# Patient Record
Sex: Male | Born: 1980 | Hispanic: Yes | Marital: Married | Smoking: Never smoker
Health system: Southern US, Community
[De-identification: ages and names within clinical notes are randomized; demographics above are authoritative.]

---

## 2013-12-28 ENCOUNTER — Emergency Department: Payer: Self-pay | Admitting: Emergency Medicine

## 2018-10-11 ENCOUNTER — Ambulatory Visit: Payer: Self-pay | Admitting: General Surgery

## 2018-10-11 NOTE — H&P (Signed)
PATIENT PROFILE: Marcus Khan is a 38 y.o. male who presents to the Clinic for consultation at the request of Dr. Ola Spurr for evaluation of ventral hernia.  PCP:  Angelena Form, MD  HISTORY OF PRESENT ILLNESS: Mr. Marcus Khan reports patient reported that he has been dealing with a hernia since 2 to 3 years ago.  Reports that the pain he was small and without major symptoms but in the last few months he has been more painful.  He denies any pain radiation.  He reports not doing heavy lifting and sports aggravate the pain.  There is no alleviating factor.  Patient reported initially he was able to put it back inside but in the last few month is tender to palpation and he cannot reduce the hernia.  There has been no abdominal distention.  There is no nausea or vomiting.  Patient tolerating diet.  Patient having bowel movement.  Patient denies any previous abdominal surgery.   PROBLEM LIST: Ventral hernia  GENERAL REVIEW OF SYSTEMS:   General ROS: negative for - chills, fatigue, fever, weight gain or weight loss Allergy and Immunology ROS: negative for - hives  Hematological and Lymphatic ROS: negative for - bleeding problems or bruising, negative for palpable nodes Endocrine ROS: negative for - heat or cold intolerance, hair changes Respiratory ROS: negative for - cough, shortness of breath or wheezing Cardiovascular ROS: no chest pain or palpitations GI ROS: negative for nausea, vomiting, abdominal pain, diarrhea, constipation Musculoskeletal ROS: negative for - joint swelling or muscle pain Neurological ROS: negative for - confusion, syncope Dermatological ROS: negative for pruritus and rash Psychiatric: negative for anxiety, depression, difficulty sleeping and memory loss  MEDICATIONS: Current Medications  No current outpatient medications on file.   No current facility-administered medications for this visit.     Patient denies any medication at  home.  ALLERGIES: Patient has no known allergies.  PAST MEDICAL HISTORY:     Past Medical History:  Diagnosis Date  . Umbilical hernia     PAST SURGICAL HISTORY:      Past Surgical History:  Procedure Laterality Date  . EXTRACTION TEETH Bilateral 2019     FAMILY HISTORY:      Family History  Problem Relation Age of Onset  . No Known Problems Mother   . Stroke Father   . No Known Problems Sister   . No Known Problems Brother   . No Known Problems Brother   . No Known Problems Brother   . No Known Problems Brother   . No Known Problems Brother      SOCIAL HISTORY: Social History          Socioeconomic History  . Marital status: Married    Spouse name: Minette Brine  . Number of children: 3  . Years of education: Not on file  . Highest education level: High school graduate  Occupational History  . Occupation: Manufacturing systems engineer  . Financial resource strain: Not on file  . Food insecurity    Worry: Not on file    Inability: Not on file  . Transportation needs    Medical: Not on file    Non-medical: Not on file  Tobacco Use  . Smoking status: Never Smoker  . Smokeless tobacco: Never Used  Substance and Sexual Activity  . Alcohol use: Not Currently  . Drug use: Never  . Sexual activity: Yes    Partners: Female  Other Topics Concern  . Not on file  Social  History Narrative  . Not on file      PHYSICAL EXAM:    Vitals:   10/11/18 0809  BP: 131/84  Pulse: 54   Body mass index is 23.87 kg/m. Weight: 71.2 kg (157 lb)   GENERAL: Alert, active, oriented x3  HEENT: Pupils equal reactive to light. Extraocular movements are intact. Sclera clear. Palpebral conjunctiva normal red color.Pharynx clear.  NECK: Supple with no palpable mass and no adenopathy.  LUNGS: Sound clear with no rales rhonchi or wheezes.  HEART: Regular rhythm S1 and S2 without murmur.  ABDOMEN: Soft and depressible, nontender with  no palpable mass, no hepatomegaly.  Patient with a ventral hernia superior to the umbilical area.  Tender to palpation.  Unable to be reduced.  There is no sign of skin changes.  EXTREMITIES: Well-developed well-nourished symmetrical with no dependent edema.  NEUROLOGICAL: Awake alert oriented, facial expression symmetrical, moving all extremities.  REVIEW OF DATA: I have reviewed the following data today:      Initial consult on 09/28/2018  Component Date Value  . Vent Rate (bpm) 09/28/2018 67   . PR Interval (msec) 09/28/2018 154   . QRS Interval (msec) 09/28/2018 84   . QT Interval (msec) 09/28/2018 402   . QTc (msec) 09/28/2018 424   . WBC (White Blood Cell Co* 09/28/2018 5.2   . RBC (Red Blood Cell Coun* 09/28/2018 5.34   . Hemoglobin 09/28/2018 15.5   . Hematocrit 09/28/2018 46.5   . MCV (Mean Corpuscular Vo* 09/28/2018 87.1   . MCH (Mean Corpuscular He* 09/28/2018 29.0   . MCHC (Mean Corpuscular H* 09/28/2018 33.3   . Platelet Count 09/28/2018 215   . RDW-CV (Red Cell Distrib* 09/28/2018 12.5   . MPV (Mean Platelet Volum* 09/28/2018 9.8   . Neutrophils 09/28/2018 2.72   . Lymphocytes 09/28/2018 1.59   . Monocytes 09/28/2018 0.36   . Eosinophils 09/28/2018 0.48   . Basophils 09/28/2018 0.07   . Neutrophil % 09/28/2018 52.0   . Lymphocyte % 09/28/2018 30.4   . Monocyte % 09/28/2018 6.9   . Eosinophil % 09/28/2018 9.2*  . Basophil% 09/28/2018 1.3   . Immature Granulocyte % 09/28/2018 0.2   . Immature Granulocyte Cou* 09/28/2018 0.01   . Glucose 09/28/2018 87   . Sodium 09/28/2018 139   . Potassium 09/28/2018 4.4   . Chloride 09/28/2018 103   . Carbon Dioxide (CO2) 09/28/2018 32.6*  . Urea Nitrogen (BUN) 09/28/2018 17   . Creatinine 09/28/2018 0.8   . Glomerular Filtration Ra* 09/28/2018 109   . Calcium 09/28/2018 9.5   . AST  09/28/2018 16   . ALT  09/28/2018 16   . Alk Phos (alkaline Phosp* 09/28/2018 139*  . Albumin 09/28/2018 4.6   . Bilirubin, Total  09/28/2018 0.5   . Protein, Total 09/28/2018 7.6   . A/G Ratio 09/28/2018 1.5   . HIV 1/2 Antibodies 09/28/2018 Negative   . HIV 1 P24 Antigen 09/28/2018 Negative   . Cholesterol, Total 09/28/2018 122   . Triglyceride 09/28/2018 87   . HDL (High Density Lipopr* 09/28/2018 36.7   . LDL Calculated 09/28/2018 68   . VLDL Cholesterol 09/28/2018 17   . Cholesterol/HDL Ratio 09/28/2018 3.3      ASSESSMENT: Mr. Marcus Khan is a 38 y.o. male presenting for consultation for ventral hernia.    The patient presents with a symptomatic, incarcerated ventral hernia. Patient was oriented about the diagnosis of ventral hernia and its implication. The patient was oriented about the  treatment alternatives (observation vs surgical repair). Due to patient symptoms, repair is recommended. Patient oriented about the surgical procedure, the use of mesh and its risk of complications such as: infection, bleeding, injury to bowel, bladder or vasculature, and chronic pain.  Incarcerated ventral hernia [K43.6]  PLAN: 1. Ventral hernia repair with mesh (12162, [id number]) 2. CBC, CMP done on 09/28/2018 3. Avoid taking aspirin 5 days before surgery 4. Contact us if has any question or concern  Patient verbalized understanding, all questions were answered, and were agreeable with the plan outlined above.    Herbert Pun, MD  Electronically signed by Herbert Pun, MD

## 2018-10-11 NOTE — H&P (View-Only) (Signed)
PATIENT PROFILE: Jadyn Barge is a 38 y.o. male who presents to the Clinic for consultation at the request of Dr. Ola Spurr for evaluation of ventral hernia.  PCP:  Angelena Form, MD  HISTORY OF PRESENT ILLNESS: Mr. Randel Pigg reports patient reported that he has been dealing with a hernia since 2 to 3 years ago.  Reports that the pain he was small and without major symptoms but in the last few months he has been more painful.  He denies any pain radiation.  He reports not doing heavy lifting and sports aggravate the pain.  There is no alleviating factor.  Patient reported initially he was able to put it back inside but in the last few month is tender to palpation and he cannot reduce the hernia.  There has been no abdominal distention.  There is no nausea or vomiting.  Patient tolerating diet.  Patient having bowel movement.  Patient denies any previous abdominal surgery.   PROBLEM LIST: Ventral hernia  GENERAL REVIEW OF SYSTEMS:   General ROS: negative for - chills, fatigue, fever, weight gain or weight loss Allergy and Immunology ROS: negative for - hives  Hematological and Lymphatic ROS: negative for - bleeding problems or bruising, negative for palpable nodes Endocrine ROS: negative for - heat or cold intolerance, hair changes Respiratory ROS: negative for - cough, shortness of breath or wheezing Cardiovascular ROS: no chest pain or palpitations GI ROS: negative for nausea, vomiting, abdominal pain, diarrhea, constipation Musculoskeletal ROS: negative for - joint swelling or muscle pain Neurological ROS: negative for - confusion, syncope Dermatological ROS: negative for pruritus and rash Psychiatric: negative for anxiety, depression, difficulty sleeping and memory loss  MEDICATIONS: Current Medications  No current outpatient medications on file.   No current facility-administered medications for this visit.     Patient denies any medication at  home.  ALLERGIES: Patient has no known allergies.  PAST MEDICAL HISTORY:     Past Medical History:  Diagnosis Date  . Umbilical hernia     PAST SURGICAL HISTORY:      Past Surgical History:  Procedure Laterality Date  . EXTRACTION TEETH Bilateral 2019     FAMILY HISTORY:      Family History  Problem Relation Age of Onset  . No Known Problems Mother   . Stroke Father   . No Known Problems Sister   . No Known Problems Brother   . No Known Problems Brother   . No Known Problems Brother   . No Known Problems Brother   . No Known Problems Brother      SOCIAL HISTORY: Social History          Socioeconomic History  . Marital status: Married    Spouse name: Minette Brine  . Number of children: 3  . Years of education: Not on file  . Highest education level: High school graduate  Occupational History  . Occupation: Manufacturing systems engineer  . Financial resource strain: Not on file  . Food insecurity    Worry: Not on file    Inability: Not on file  . Transportation needs    Medical: Not on file    Non-medical: Not on file  Tobacco Use  . Smoking status: Never Smoker  . Smokeless tobacco: Never Used  Substance and Sexual Activity  . Alcohol use: Not Currently  . Drug use: Never  . Sexual activity: Yes    Partners: Female  Other Topics Concern  . Not on file  Social  History Narrative  . Not on file      PHYSICAL EXAM:    Vitals:   10/11/18 0809  BP: 131/84  Pulse: 54   Body mass index is 23.87 kg/m. Weight: 71.2 kg (157 lb)   GENERAL: Alert, active, oriented x3  HEENT: Pupils equal reactive to light. Extraocular movements are intact. Sclera clear. Palpebral conjunctiva normal red color.Pharynx clear.  NECK: Supple with no palpable mass and no adenopathy.  LUNGS: Sound clear with no rales rhonchi or wheezes.  HEART: Regular rhythm S1 and S2 without murmur.  ABDOMEN: Soft and depressible, nontender with  no palpable mass, no hepatomegaly.  Patient with a ventral hernia superior to the umbilical area.  Tender to palpation.  Unable to be reduced.  There is no sign of skin changes.  EXTREMITIES: Well-developed well-nourished symmetrical with no dependent edema.  NEUROLOGICAL: Awake alert oriented, facial expression symmetrical, moving all extremities.  REVIEW OF DATA: I have reviewed the following data today:      Initial consult on 09/28/2018  Component Date Value  . Vent Rate (bpm) 09/28/2018 67   . PR Interval (msec) 09/28/2018 154   . QRS Interval (msec) 09/28/2018 84   . QT Interval (msec) 09/28/2018 402   . QTc (msec) 09/28/2018 424   . WBC (White Blood Cell Co* 09/28/2018 5.2   . RBC (Red Blood Cell Coun* 09/28/2018 5.34   . Hemoglobin 09/28/2018 15.5   . Hematocrit 09/28/2018 46.5   . MCV (Mean Corpuscular Vo* 09/28/2018 87.1   . MCH (Mean Corpuscular He* 09/28/2018 29.0   . MCHC (Mean Corpuscular H* 09/28/2018 33.3   . Platelet Count 09/28/2018 215   . RDW-CV (Red Cell Distrib* 09/28/2018 12.5   . MPV (Mean Platelet Volum* 09/28/2018 9.8   . Neutrophils 09/28/2018 2.72   . Lymphocytes 09/28/2018 1.59   . Monocytes 09/28/2018 0.36   . Eosinophils 09/28/2018 0.48   . Basophils 09/28/2018 0.07   . Neutrophil % 09/28/2018 52.0   . Lymphocyte % 09/28/2018 30.4   . Monocyte % 09/28/2018 6.9   . Eosinophil % 09/28/2018 9.2*  . Basophil% 09/28/2018 1.3   . Immature Granulocyte % 09/28/2018 0.2   . Immature Granulocyte Cou* 09/28/2018 0.01   . Glucose 09/28/2018 87   . Sodium 09/28/2018 139   . Potassium 09/28/2018 4.4   . Chloride 09/28/2018 103   . Carbon Dioxide (CO2) 09/28/2018 32.6*  . Urea Nitrogen (BUN) 09/28/2018 17   . Creatinine 09/28/2018 0.8   . Glomerular Filtration Ra* 09/28/2018 109   . Calcium 09/28/2018 9.5   . AST  09/28/2018 16   . ALT  09/28/2018 16   . Alk Phos (alkaline Phosp* 09/28/2018 139*  . Albumin 09/28/2018 4.6   . Bilirubin, Total  09/28/2018 0.5   . Protein, Total 09/28/2018 7.6   . A/G Ratio 09/28/2018 1.5   . HIV 1/2 Antibodies 09/28/2018 Negative   . HIV 1 P24 Antigen 09/28/2018 Negative   . Cholesterol, Total 09/28/2018 122   . Triglyceride 09/28/2018 87   . HDL (High Density Lipopr* 09/28/2018 36.7   . LDL Calculated 09/28/2018 68   . VLDL Cholesterol 09/28/2018 17   . Cholesterol/HDL Ratio 09/28/2018 3.3      ASSESSMENT: Mr. Randel Pigg is a 38 y.o. male presenting for consultation for ventral hernia.    The patient presents with a symptomatic, incarcerated ventral hernia. Patient was oriented about the diagnosis of ventral hernia and its implication. The patient was oriented about the  treatment alternatives (observation vs surgical repair). Due to patient symptoms, repair is recommended. Patient oriented about the surgical procedure, the use of mesh and its risk of complications such as: infection, bleeding, injury to bowel, bladder or vasculature, and chronic pain.  Incarcerated ventral hernia [K43.6]  PLAN: 1. Ventral hernia repair with mesh (12162, [id number]) 2. CBC, CMP done on 09/28/2018 3. Avoid taking aspirin 5 days before surgery 4. Contact us if has any question or concern  Patient verbalized understanding, all questions were answered, and were agreeable with the plan outlined above.    Herbert Pun, MD  Electronically signed by Herbert Pun, MD

## 2018-10-13 ENCOUNTER — Other Ambulatory Visit
Admission: RE | Admit: 2018-10-13 | Discharge: 2018-10-13 | Disposition: A | Discharge status: Home / Self Care | Payer: 59 | Source: Ambulatory Visit | Attending: General Surgery | Admitting: General Surgery

## 2018-10-13 ENCOUNTER — Other Ambulatory Visit: Payer: Self-pay

## 2018-10-13 DIAGNOSIS — Z01812 Encounter for preprocedural laboratory examination: Secondary | ICD-10-CM | POA: Diagnosis present

## 2018-10-13 DIAGNOSIS — Z20828 Contact with and (suspected) exposure to other viral communicable diseases: Secondary | ICD-10-CM | POA: Diagnosis not present

## 2018-10-13 LAB — SARS CORONAVIRUS 2 (TAT 6-24 HRS): SARS Coronavirus 2: NEGATIVE

## 2018-10-18 ENCOUNTER — Other Ambulatory Visit: Payer: Self-pay

## 2018-10-18 ENCOUNTER — Ambulatory Visit: Payer: No Typology Code available for payment source | Admitting: Anesthesiology

## 2018-10-18 ENCOUNTER — Encounter
Admission: RE | Disposition: A | Discharge status: Home / Self Care | Payer: Self-pay | Source: Home / Self Care | Attending: General Surgery

## 2018-10-18 ENCOUNTER — Encounter: Payer: Self-pay | Admitting: *Deleted

## 2018-10-18 ENCOUNTER — Ambulatory Visit
Admission: RE | Admit: 2018-10-18 | Discharge: 2018-10-18 | Disposition: A | Discharge status: Home / Self Care | Payer: No Typology Code available for payment source | Attending: General Surgery | Admitting: General Surgery

## 2018-10-18 DIAGNOSIS — Z823 Family history of stroke: Secondary | ICD-10-CM | POA: Diagnosis not present

## 2018-10-18 DIAGNOSIS — K436 Other and unspecified ventral hernia with obstruction, without gangrene: Secondary | ICD-10-CM | POA: Insufficient documentation

## 2018-10-18 HISTORY — PX: VENTRAL HERNIA REPAIR: SHX424

## 2018-10-18 SURGERY — REPAIR, HERNIA, VENTRAL
Anesthesia: General | Site: Abdomen

## 2018-10-18 MED ORDER — MIDAZOLAM HCL 2 MG/2ML IJ SOLN
INTRAMUSCULAR | Status: DC | PRN
  Administered 2018-10-18: 2 mg via INTRAVENOUS

## 2018-10-18 MED ORDER — FENTANYL CITRATE (PF) 100 MCG/2ML IJ SOLN
25.0000 ug | INTRAMUSCULAR | Status: DC | PRN
  Administered 2018-10-18 (×4): 25 ug via INTRAVENOUS

## 2018-10-18 MED ORDER — FENTANYL CITRATE (PF) 100 MCG/2ML IJ SOLN
INTRAMUSCULAR | Status: AC
  Filled 2018-10-18: qty 2

## 2018-10-18 MED ORDER — FAMOTIDINE 20 MG PO TABS
ORAL_TABLET | ORAL | Status: AC
  Administered 2018-10-18: 07:00:00 20 mg via ORAL
  Filled 2018-10-18: qty 1

## 2018-10-18 MED ORDER — SUGAMMADEX SODIUM 200 MG/2ML IV SOLN
INTRAVENOUS | Status: AC
  Filled 2018-10-18: qty 2

## 2018-10-18 MED ORDER — LACTATED RINGERS IV SOLN
INTRAVENOUS | Status: DC
  Administered 2018-10-18: 08:00:00 via INTRAVENOUS

## 2018-10-18 MED ORDER — LIDOCAINE HCL (PF) 2 % IJ SOLN
INTRAMUSCULAR | Status: AC
  Filled 2018-10-18: qty 10

## 2018-10-18 MED ORDER — PROPOFOL 10 MG/ML IV BOLUS
INTRAVENOUS | Status: DC | PRN
  Administered 2018-10-18: 50 mg via INTRAVENOUS
  Administered 2018-10-18: 150 mg via INTRAVENOUS

## 2018-10-18 MED ORDER — BUPIVACAINE-EPINEPHRINE 0.5% -1:200000 IJ SOLN
INTRAMUSCULAR | Status: DC | PRN
  Administered 2018-10-18: 10 mL

## 2018-10-18 MED ORDER — ACETAMINOPHEN 10 MG/ML IV SOLN
INTRAVENOUS | Status: AC
  Filled 2018-10-18: qty 100

## 2018-10-18 MED ORDER — DEXAMETHASONE SODIUM PHOSPHATE 10 MG/ML IJ SOLN
INTRAMUSCULAR | Status: DC | PRN
  Administered 2018-10-18: 10 mg via INTRAVENOUS

## 2018-10-18 MED ORDER — FENTANYL CITRATE (PF) 100 MCG/2ML IJ SOLN
INTRAMUSCULAR | Status: AC
  Administered 2018-10-18: 25 ug via INTRAVENOUS
  Filled 2018-10-18: qty 2

## 2018-10-18 MED ORDER — CEFAZOLIN SODIUM-DEXTROSE 2-4 GM/100ML-% IV SOLN
INTRAVENOUS | Status: AC
  Filled 2018-10-18: qty 100

## 2018-10-18 MED ORDER — SUCCINYLCHOLINE CHLORIDE 20 MG/ML IJ SOLN
INTRAMUSCULAR | Status: DC | PRN
  Administered 2018-10-18: 100 mg via INTRAVENOUS

## 2018-10-18 MED ORDER — CEFAZOLIN SODIUM-DEXTROSE 2-4 GM/100ML-% IV SOLN
2.0000 g | INTRAVENOUS | Status: AC
  Administered 2018-10-18: 2 g via INTRAVENOUS

## 2018-10-18 MED ORDER — LIDOCAINE HCL (CARDIAC) PF 100 MG/5ML IV SOSY
PREFILLED_SYRINGE | INTRAVENOUS | Status: DC | PRN
  Administered 2018-10-18: 100 mg via INTRAVENOUS

## 2018-10-18 MED ORDER — ONDANSETRON HCL 4 MG/2ML IJ SOLN: 4.0000 mg | Freq: Once | INTRAMUSCULAR | Status: DC | PRN

## 2018-10-18 MED ORDER — ONDANSETRON HCL 4 MG/2ML IJ SOLN
INTRAMUSCULAR | Status: AC
  Filled 2018-10-18: qty 2

## 2018-10-18 MED ORDER — DEXAMETHASONE SODIUM PHOSPHATE 10 MG/ML IJ SOLN
INTRAMUSCULAR | Status: AC
  Filled 2018-10-18: qty 1

## 2018-10-18 MED ORDER — FAMOTIDINE 20 MG PO TABS
20.0000 mg | ORAL_TABLET | ORAL | Status: AC
  Administered 2018-10-18: 07:00:00 20 mg via ORAL

## 2018-10-18 MED ORDER — ONDANSETRON HCL 4 MG/2ML IJ SOLN
INTRAMUSCULAR | Status: DC | PRN
  Administered 2018-10-18: 4 mg via INTRAVENOUS

## 2018-10-18 MED ORDER — FENTANYL CITRATE (PF) 100 MCG/2ML IJ SOLN
INTRAMUSCULAR | Status: DC | PRN
  Administered 2018-10-18 (×2): 50 ug via INTRAVENOUS

## 2018-10-18 MED ORDER — ACETAMINOPHEN 10 MG/ML IV SOLN
INTRAVENOUS | Status: DC | PRN
  Administered 2018-10-18: 1000 mg via INTRAVENOUS

## 2018-10-18 MED ORDER — SUGAMMADEX SODIUM 200 MG/2ML IV SOLN
INTRAVENOUS | Status: DC | PRN
  Administered 2018-10-18: 200 mg via INTRAVENOUS

## 2018-10-18 MED ORDER — HYDROCODONE-ACETAMINOPHEN 5-325 MG PO TABS: 1.0000 | ORAL_TABLET | ORAL | 0 refills | Status: AC | PRN

## 2018-10-18 MED ORDER — ROCURONIUM BROMIDE 100 MG/10ML IV SOLN
INTRAVENOUS | Status: DC | PRN
  Administered 2018-10-18: 5 mg via INTRAVENOUS
  Administered 2018-10-18: 45 mg via INTRAVENOUS

## 2018-10-18 MED ORDER — BUPIVACAINE-EPINEPHRINE (PF) 0.5% -1:200000 IJ SOLN
INTRAMUSCULAR | Status: AC
  Filled 2018-10-18: qty 30

## 2018-10-18 MED ORDER — MIDAZOLAM HCL 2 MG/2ML IJ SOLN
INTRAMUSCULAR | Status: AC
  Filled 2018-10-18: qty 2

## 2018-10-18 SURGICAL SUPPLY — 31 items
BLADE SURG 15 STRL LF DISP TIS (BLADE) ×1 IMPLANT
BLADE SURG 15 STRL SS (BLADE) ×2
CANISTER SUCT 1200ML W/VALVE (MISCELLANEOUS) ×3 IMPLANT
CHLORAPREP W/TINT 26 (MISCELLANEOUS) ×3 IMPLANT
COVER WAND RF STERILE (DRAPES) ×3 IMPLANT
DERMABOND ADVANCED (GAUZE/BANDAGES/DRESSINGS) ×2
DERMABOND ADVANCED .7 DNX12 (GAUZE/BANDAGES/DRESSINGS) ×1 IMPLANT
DRAPE LAPAROTOMY 100X77 ABD (DRAPES) ×3 IMPLANT
ELECT REM PT RETURN 9FT ADLT (ELECTROSURGICAL) ×3
ELECTRODE REM PT RTRN 9FT ADLT (ELECTROSURGICAL) ×1 IMPLANT
GAUZE SPONGE 4X4 12PLY STRL (GAUZE/BANDAGES/DRESSINGS) IMPLANT
GLOVE BIO SURGEON STRL SZ 6.5 (GLOVE) ×2 IMPLANT
GLOVE BIO SURGEONS STRL SZ 6.5 (GLOVE) ×1
GLOVE BIOGEL PI IND STRL 6.5 (GLOVE) ×1 IMPLANT
GLOVE BIOGEL PI INDICATOR 6.5 (GLOVE) ×2
GOWN STRL REUS W/ TWL LRG LVL3 (GOWN DISPOSABLE) ×1 IMPLANT
GOWN STRL REUS W/TWL LRG LVL3 (GOWN DISPOSABLE) ×2
KIT TURNOVER KIT A (KITS) ×3 IMPLANT
LABEL OR SOLS (LABEL) ×3 IMPLANT
NEEDLE HYPO 25X1 1.5 SAFETY (NEEDLE) ×3 IMPLANT
NS IRRIG 500ML POUR BTL (IV SOLUTION) ×3 IMPLANT
PACK BASIN MINOR ARMC (MISCELLANEOUS) ×3 IMPLANT
SUT MNCRL 4-0 (SUTURE) ×2
SUT MNCRL 4-0 27XMFL (SUTURE) ×1
SUT PDS PLUS 0 (SUTURE) ×4
SUT PDS PLUS AB 0 CT-2 (SUTURE) ×2 IMPLANT
SUT PROLENE 0 CT 2 (SUTURE) ×6 IMPLANT
SUT VIC AB 2-0 SH 27 (SUTURE) ×2
SUT VIC AB 2-0 SH 27XBRD (SUTURE) ×1 IMPLANT
SUTURE MNCRL 4-0 27XMF (SUTURE) ×1 IMPLANT
SYR 10ML LL (SYRINGE) ×3 IMPLANT

## 2018-10-18 NOTE — Interval H&P Note (Signed)
History and Physical Interval Note:  10/18/2018 7:41 AM  Marcus Khan  has presented today for surgery, with the diagnosis of K43.6 Incarcerated ventral hernia.  The various methods of treatment have been discussed with the patient and family. After consideration of risks, benefits and other options for treatment, the patient has consented to  Procedure(s): HERNIA REPAIR VENTRAL ADULT - OPEN (N/A) as a surgical intervention.  The patient's history has been reviewed, patient examined, no change in status, stable for surgery.  I have reviewed the patient's chart and labs.  Questions were answered to the patient's satisfaction.     Herbert Pun

## 2018-10-18 NOTE — Anesthesia Procedure Notes (Signed)
Procedure Name: Intubation Date/Time: 10/18/2018 9:03 AM Performed by: Aline Brochure, CRNA Pre-anesthesia Checklist: Patient identified, Emergency Drugs available, Suction available and Patient being monitored Patient Re-evaluated:Patient Re-evaluated prior to induction Oxygen Delivery Method: Circle system utilized Preoxygenation: Pre-oxygenation with 100% oxygen Induction Type: IV induction Ventilation: Mask ventilation without difficulty Laryngoscope Size: Mac and 4 Grade View: Grade II Tube type: Oral Tube size: 7.0 mm Number of attempts: 3 Airway Equipment and Method: Stylet Placement Confirmation: ETT inserted through vocal cords under direct vision,  positive ETCO2 and breath sounds checked- equal and bilateral Secured at: 22 cm Tube secured with: Tape Dental Injury: Bloody posterior oropharynx

## 2018-10-18 NOTE — Anesthesia Preprocedure Evaluation (Signed)
Anesthesia Evaluation  Patient identified by MRN, date of birth, ID band Patient awake    Reviewed: Allergy & Precautions, NPO status , Patient's Chart, lab work & pertinent test results  Airway Mallampati: II  TM Distance: >3 FB     Dental   Pulmonary neg pulmonary ROS,    Pulmonary exam normal        Cardiovascular negative cardio ROS Normal cardiovascular exam     Neuro/Psych negative neurological ROS  negative psych ROS   GI/Hepatic Neg liver ROS,   Endo/Other  negative endocrine ROS  Renal/GU negative Renal ROS  negative genitourinary   Musculoskeletal negative musculoskeletal ROS (+)   Abdominal Normal abdominal exam  (+)   Peds negative pediatric ROS (+)  Hematology negative hematology ROS (+)   Anesthesia Other Findings History reviewed. No pertinent past medical history.  Reproductive/Obstetrics                             Anesthesia Physical Anesthesia Plan  ASA: I  Anesthesia Plan: General   Post-op Pain Management:    Induction: Intravenous  PONV Risk Score and Plan:   Airway Management Planned: Oral ETT  Additional Equipment:   Intra-op Plan:   Post-operative Plan: Extubation in OR  Informed Consent: I have reviewed the patients History and Physical, chart, labs and discussed the procedure including the risks, benefits and alternatives for the proposed anesthesia with the patient or authorized representative who has indicated his/her understanding and acceptance.     Dental advisory given  Plan Discussed with: CRNA and Surgeon  Anesthesia Plan Comments:         Anesthesia Quick Evaluation

## 2018-10-18 NOTE — Op Note (Signed)
Preoperative diagnosis: Ventral hernia.   Postoperative diagnosis: Ventral hernia.   Procedure: Ventral hernia repair  Anesthesia: GETA   Surgeon: Dr. Windell Moment   Wound Classification: Clean   Indications: Patient is a 38 y.o. male developed a symptomatic incarcerated ventral hernia. This was incarcerated with pain and repair was indicated.    Findings: 1. A 0.8 cm ventral hernia  2.   Primary tension-free repair was able to be performed.   3. No hollow viscus organ injury identified during procedure 4. Tension free repair achieved 5. Adequate hemostasis   Description of procedure: The patient was brought to the operating room and general anesthesia was induced. A time-out was completed verifying correct patient, procedure, site, positioning, and implant(s) and/or special equipment prior to beginning this procedure. Antibiotics were administered prior to making the incision. The anterior abdominal wall was prepped and draped in the standard sterile fashion. A  vertical incision was made over the palpable incarcerated hernia content. The incision was deepened to the fascia. The hernia sac was then identified and dissected free. The peritoneum of the sac was dissected from the anterior fascia and able to be reduced easily. The fascia was carefully palpated no additional defects were identified.  Since the defect was less than 1 cm, decision was made not to insert a mesh.  The hernia defect was closed with interrupted PDS 0 suture on midline.  Subcutaneous tissue was approximated with 2-0 Vicryl.  The skin was closed with subcuticular sutures of Monocryl 4-0. Wound covered with Dermabond.   The patient tolerated the procedure well and was brought to the postanesthesia care unit in stable condition.    Specimen:  Hernia content    Complications: None   Estimated Blood Loss: 2 mL

## 2018-10-18 NOTE — Discharge Instructions (Signed)
  Diet: Resume home heart healthy regular diet.   Activity: No heavy lifting >20 pounds (children, pets, laundry, garbage) or strenuous activity until follow-up, but light activity and walking are encouraged. Do not drive or drink alcohol if taking narcotic pain medications.  Wound care: May shower with soapy water and pat dry (do not rub incisions), but no baths or submerging incision underwater until follow-up. (no swimming)   Medications: Resume all home medications. For mild to moderate pain: acetaminophen (Tylenol) or ibuprofen (if no kidney disease). Combining Tylenol with alcohol can substantially increase your risk of causing liver disease. Narcotic pain medications, if prescribed, can be used for severe pain, though may cause nausea, constipation, and drowsiness. Do not combine Tylenol and Norco within a 6 hour period as Norco contains Tylenol. If you do not need the narcotic pain medication, you do not need to fill the prescription.  Call office ([phone]) at any time if any questions, worsening pain, fevers/chills, bleeding, drainage from incision site, or other concerns.   AMBULATORY SURGERY  DISCHARGE INSTRUCTIONS   1) The drugs that you were given will stay in your system until tomorrow so for the next 24 hours you should not:  A) Drive an automobile B) Make any legal decisions C) Drink any alcoholic beverage   2) You may resume regular meals tomorrow.  Today it is better to start with liquids and gradually work up to solid foods.  You may eat anything you prefer, but it is better to start with liquids, then soup and crackers, and gradually work up to solid foods.   3) Please notify your doctor immediately if you have any unusual bleeding, trouble breathing, redness and pain at the surgery site, drainage, fever, or pain not relieved by medication.    4) Additional Instructions:        Please contact your physician with any problems or Same Day Surgery at  [phone], Monday through Friday 6 am to 4 pm, or  at [place] Main number at [phone]. 

## 2018-10-18 NOTE — Transfer of Care (Signed)
Immediate Anesthesia Transfer of Care Note  Patient: Marcus Khan  Procedure(s) Performed: HERNIA REPAIR VENTRAL ADULT - OPEN (N/A Abdomen)  Patient Location: PACU  Anesthesia Type:General  Level of Consciousness: sedated  Airway & Oxygen Therapy: Patient connected to face mask oxygen  Post-op Assessment: Post -op Vital signs reviewed and stable  Post vital signs: stable  Last Vitals:  Vitals Value Taken Time  BP 104/59 10/18/18 0951  Temp 36.1 C 10/18/18 0951  Pulse 73 10/18/18 0952  Resp 15 10/18/18 0952  SpO2 100 % 10/18/18 0952  Vitals shown include unvalidated device data.  Last Pain:  Vitals:   10/18/18 0707  TempSrc: Tympanic  PainSc: 0-No pain         Complications: No apparent anesthesia complications

## 2018-10-18 NOTE — Anesthesia Post-op Follow-up Note (Signed)
Anesthesia QCDR form completed.        

## 2018-10-19 LAB — SURGICAL PATHOLOGY

## 2018-10-20 NOTE — Anesthesia Postprocedure Evaluation (Signed)
Anesthesia Post Note  Patient: Marcus Khan  Procedure(s) Performed: HERNIA REPAIR VENTRAL ADULT - OPEN (N/A Abdomen)  Patient location during evaluation: PACU Anesthesia Type: General Level of consciousness: awake and alert and oriented Pain management: pain level controlled Vital Signs Assessment: post-procedure vital signs reviewed and stable Respiratory status: spontaneous breathing Cardiovascular status: blood pressure returned to baseline Anesthetic complications: no     Last Vitals:  Vitals:   10/18/18 1044 10/18/18 1056  BP: 138/84 128/77  Pulse: 62 66  Resp: 13 18  Temp: (!) 36.2 C 36.8 C  SpO2: 98% 99%    Last Pain:  Vitals:   10/18/18 1056  TempSrc: Oral  PainSc: 0-No pain                 Gianluca Chhim

## 2019-03-24 ENCOUNTER — Ambulatory Visit: Payer: 59 | Attending: Internal Medicine

## 2019-03-24 DIAGNOSIS — Z23 Encounter for immunization: Secondary | ICD-10-CM

## 2019-03-24 NOTE — Progress Notes (Signed)
   Covid-19 Vaccination Clinic  Name:  Marcus Khan    MRN: [id number] DOB: May 20, 1980  03/24/2019  Mr. Marcus Khan was observed post Covid-19 immunization for 15 minutes without incident. He was provided with Vaccine Information Sheet and instruction to access the V-Safe system.   Mr. Marcus Khan was instructed to call 911 with any severe reactions post vaccine: Marland Kitchen Difficulty breathing  . Swelling of face and throat  . A fast heartbeat  . A bad rash all over body  . Dizziness and weakness   Immunizations Administered    Name Date Dose VIS Date Route   Pfizer COVID-19 Vaccine 03/24/2019  7:55 PM 0.3 mL 12/15/2018 Intramuscular   Manufacturer: [employer], [employer]   Lot: [id number]   NDC: [address]-3

## 2019-04-14 ENCOUNTER — Ambulatory Visit: Payer: Self-pay | Attending: Internal Medicine

## 2019-04-14 DIAGNOSIS — Z23 Encounter for immunization: Secondary | ICD-10-CM

## 2019-04-14 NOTE — Progress Notes (Signed)
   Covid-19 Vaccination Clinic  Name:  Marcus Khan    MRN: [id number] DOB: [birth year]  04/14/2019  Mr. Marcus Khan was observed post Covid-19 immunization for 15 minutes without incident. He was provided with Vaccine Information Sheet and instruction to access the V-Safe system. Medical Interpreter used.  Mr. Marcus Khan was instructed to call 911 with any severe reactions post vaccine: Marland Kitchen Difficulty breathing  . Swelling of face and throat  . A fast heartbeat  . A bad rash all over body  . Dizziness and weakness   Immunizations Administered    Name Date Dose VIS Date Route   Pfizer COVID-19 Vaccine 04/14/2019  6:08 PM 0.3 mL 12/15/2018 Intramuscular   Manufacturer: [employer], [employer]   Lot: [id number]   NDC: [address]-9

## 2019-08-07 ENCOUNTER — Encounter: Payer: Self-pay | Admitting: Emergency Medicine

## 2019-08-07 ENCOUNTER — Other Ambulatory Visit: Payer: Self-pay

## 2019-08-07 ENCOUNTER — Emergency Department
Admission: EM | Admit: 2019-08-07 | Discharge: 2019-08-07 | Disposition: A | Discharge status: Home / Self Care | Payer: No Typology Code available for payment source | Attending: Emergency Medicine | Admitting: Emergency Medicine

## 2019-08-07 ENCOUNTER — Emergency Department: Payer: No Typology Code available for payment source

## 2019-08-07 DIAGNOSIS — A749 Chlamydial infection, unspecified: Secondary | ICD-10-CM | POA: Insufficient documentation

## 2019-08-07 DIAGNOSIS — R300 Dysuria: Secondary | ICD-10-CM | POA: Diagnosis not present

## 2019-08-07 DIAGNOSIS — L03115 Cellulitis of right lower limb: Secondary | ICD-10-CM | POA: Diagnosis not present

## 2019-08-07 DIAGNOSIS — M25461 Effusion, right knee: Secondary | ICD-10-CM | POA: Diagnosis present

## 2019-08-07 LAB — URINALYSIS, COMPLETE (UACMP) WITH MICROSCOPIC
Bacteria, UA: NONE SEEN
Bilirubin Urine: NEGATIVE
Glucose, UA: NEGATIVE mg/dL
Hgb urine dipstick: NEGATIVE
Ketones, ur: 5 mg/dL — AB
Leukocytes,Ua: NEGATIVE
Nitrite: NEGATIVE
Protein, ur: NEGATIVE mg/dL
Specific Gravity, Urine: 1.019 (ref 1.005–1.030)
pH: 5 (ref 5.0–8.0)

## 2019-08-07 LAB — CBC WITH DIFFERENTIAL/PLATELET
Abs Immature Granulocytes: 0.04 10*3/uL (ref 0.00–0.07)
Basophils Absolute: 0.1 10*3/uL (ref 0.0–0.1)
Basophils Relative: 1 %
Eosinophils Absolute: 0.5 10*3/uL (ref 0.0–0.5)
Eosinophils Relative: 4 %
HCT: 41.1 % (ref 39.0–52.0)
Hemoglobin: 13.7 g/dL (ref 13.0–17.0)
Immature Granulocytes: 0 %
Lymphocytes Relative: 12 %
Lymphs Abs: 1.5 10*3/uL (ref 0.7–4.0)
MCH: 29.3 pg (ref 26.0–34.0)
MCHC: 33.3 g/dL (ref 30.0–36.0)
MCV: 87.8 fL (ref 80.0–100.0)
Monocytes Absolute: 0.8 10*3/uL (ref 0.1–1.0)
Monocytes Relative: 7 %
Neutro Abs: 9.1 10*3/uL — ABNORMAL HIGH (ref 1.7–7.7)
Neutrophils Relative %: 76 %
Platelets: 187 10*3/uL (ref 150–400)
RBC: 4.68 MIL/uL (ref 4.22–5.81)
RDW: 13.3 % (ref 11.5–15.5)
WBC: 12.1 10*3/uL — ABNORMAL HIGH (ref 4.0–10.5)
nRBC: 0 % (ref 0.0–0.2)

## 2019-08-07 LAB — BASIC METABOLIC PANEL
Anion gap: 8 (ref 5–15)
BUN: 12 mg/dL (ref 6–20)
CO2: 28 mmol/L (ref 22–32)
Calcium: 9 mg/dL (ref 8.9–10.3)
Chloride: 103 mmol/L (ref 98–111)
Creatinine, Ser: 0.98 mg/dL (ref 0.61–1.24)
GFR calc Af Amer: >60 mL/min (ref >60)
GFR calc non Af Amer: >60 mL/min (ref >60)
Glucose, Bld: 96 mg/dL (ref 70–99)
Potassium: 3.7 mmol/L (ref 3.5–5.1)
Sodium: 139 mmol/L (ref 135–145)

## 2019-08-07 LAB — CHLAMYDIA/NGC RT PCR (ARMC ONLY)
Chlamydia Tr: DETECTED — AB
N gonorrhoeae: NOT DETECTED

## 2019-08-07 LAB — URIC ACID: Uric Acid, Serum: 4.5 mg/dL (ref 3.7–8.6)

## 2019-08-07 MED ORDER — CEFAZOLIN SODIUM-DEXTROSE 2-4 GM/100ML-% IV SOLN
2.0000 g | Freq: Once | INTRAVENOUS | Status: AC
  Administered 2019-08-07: 2 g via INTRAVENOUS
  Filled 2019-08-07: qty 100

## 2019-08-07 MED ORDER — AZITHROMYCIN 500 MG PO TABS
1000.0000 mg | ORAL_TABLET | Freq: Once | ORAL | Status: AC
  Administered 2019-08-07: 1000 mg via ORAL
  Filled 2019-08-07: qty 2

## 2019-08-07 MED ORDER — SULFAMETHOXAZOLE-TRIMETHOPRIM 800-160 MG PO TABS: 1.0000 | ORAL_TABLET | Freq: Two times a day (BID) | ORAL | 0 refills | Status: AC

## 2019-08-07 NOTE — ED Notes (Signed)
Spanish interpreter used for discharge

## 2019-08-07 NOTE — ED Notes (Signed)
Medication administration information given via Video interpreter. Patient also c/o 8/10 headache.  Wife is at bedside.

## 2019-08-07 NOTE — ED Provider Notes (Signed)
[facility] Emergency Department Provider Note  ____________________________________________   First MD Initiated Contact with Patient 08/07/19 1418     (approximate)  I have reviewed the triage vital signs and the nursing notes.   HISTORY  Chief Complaint Knee Pain    HPI Lopaka T Jacobe Study is a 39 y.o. male presents emergency department complaint of right knee swelling.  Right knee started to hurt yesterday and became red and swollen overnight.  Patient states he has had some fever and chills.  Also has some burning with urination.  He denies any known injury.    History reviewed. No pertinent past medical history.  There are no problems to display for this patient.   Past Surgical History:  Procedure Laterality Date   VENTRAL HERNIA REPAIR N/A 10/18/2018   Procedure: HERNIA REPAIR VENTRAL ADULT - OPEN;  Surgeon: Carolan Shiver, MD;  Location: [facility] ORS;  Service: General;  Laterality: N/A;    Prior to Admission medications   Medication Sig Start Date End Date Taking? Authorizing Provider  sulfamethoxazole-trimethoprim (BACTRIM DS) 800-160 MG tablet Take 1 tablet by mouth 2 (two) times daily. 08/07/19   Faythe Ghee, PA-C    Allergies Patient has no known allergies.  No family history on file.  Social History Social History   Tobacco Use   Smoking status: Never Smoker   Smokeless tobacco: Never Used  Substance Use Topics   Alcohol use: Never   Drug use: Never    Review of Systems  Constitutional: Positive fever/chills Eyes: No visual changes. ENT: No sore throat. Respiratory: Denies cough Cardiovascular: Denies chest pain Gastrointestinal: Denies abdominal pain Genitourinary: Negative for dysuria. Musculoskeletal: Negative for back pain.  Positive right knee pain Skin: Negative for rash. Psychiatric: no mood changes,     ____________________________________________   PHYSICAL EXAM:  VITAL SIGNS: ED  Triage Vitals [08/07/19 1412]  Enc Vitals Group     BP 135/81     Pulse Rate 88     Resp 20     Temp (!) 97.5 F (36.4 C)     Temp Source Oral     SpO2 100 %     Weight 154 lb (69.9 kg)     Height 5\' 7"  (1.702 m)     Head Circumference      Peak Flow      Pain Score 8     Pain Loc      Pain Edu?      Excl. in GC?     Constitutional: Alert and oriented. Well appearing and in no acute distress. Eyes: Conjunctivae are normal.  Head: Atraumatic. Nose: No congestion/rhinnorhea. Mouth/Throat: Mucous membranes are moist.   Neck:  supple no lymphadenopathy noted Cardiovascular: Normal rate, regular rhythm. Heart sounds are normal Respiratory: Normal respiratory effort.  No retractions, lungs c t a  GU: deferred Musculoskeletal: Right knee is swollen and tender at the joint line, redness extends from the tibial tuberosity to the suprapatellar bursa, neurovascular is intact  neurologic:  Normal speech and language.  Skin:  Skin is warm, dry and intact. No rash noted.  Positive redness and swelling of the right knee Psychiatric: Mood and affect are normal. Speech and behavior are normal.  ____________________________________________   LABS (all labs ordered are listed, but only abnormal results are displayed)  Labs Reviewed  CHLAMYDIA/NGC RT PCR ([facility] ONLY) - Abnormal; Notable for the following components:      Result Value   Chlamydia Tr DETECTED (*)  All other components within normal limits  URINALYSIS, COMPLETE (UACMP) WITH MICROSCOPIC - Abnormal; Notable for the following components:   Color, Urine YELLOW (*)    APPearance HAZY (*)    Ketones, ur 5 (*)    All other components within normal limits  CBC WITH DIFFERENTIAL/PLATELET - Abnormal; Notable for the following components:   WBC 12.1 (*)    Neutro Abs 9.1 (*)    All other components within normal limits  BASIC METABOLIC PANEL  URIC ACID    ____________________________________________   ____________________________________________  RADIOLOGY  X-ray of the right knee is negative for any acute abnormality  ____________________________________________   PROCEDURES  Procedure(s) performed: No  Procedures    ____________________________________________   INITIAL IMPRESSION / ASSESSMENT AND PLAN / ED COURSE  Pertinent labs & imaging results that were available during my care of the patient were reviewed by me and considered in my medical decision making (see chart for details).   The patient is 39 year old male presents emergency department with concerns of swollen red knee.  Some fever and chills.  And difficulty with urination.  See HPI  Physical exam shows patient to appear well.  Vitals are basically normal.  Right knee is red and swollen and tender  DDx: Cellulitis, septic joint, chlamydia infection  CBC has elevated WBC of 12.1, urinalysis normal, basic metabolic panel is normal,  Patient has been n.p.o. all day.  I did page Dr. Joice Lofts, I discussed my concerns of a septic joint with him.  He will be coming down from the OR to evaluate the patient.  I did let the patient know the current treatment plan.  Explained to him that if Dr. Joice Lofts thinks it is a infected joint he will need to go to the operating room.  He is to remain n.p.o. while here in the ED.   Dr.Poggi in to see the patient.  Does not feel this is a septic joint but more of a cellulitis/bursitis along with his STD.  He recommends Ancef 2 g IV, Septra DS 1 twice daily for 10 days.  Follow-up in his office this week.  Patient was given the above medications.  Was also given Zithromax 1 g p.o.  His wife was also treated while here in the ED.  He was discharged with prescription for Septra DS twice daily.  He was discharged stable condition.    Tanav T Caidon Foti was evaluated in Emergency Department on 08/07/2019 for the symptoms described in the  history of present illness. He was evaluated in the context of the global COVID-19 pandemic, which necessitated consideration that the patient might be at risk for infection with the SARS-CoV-2 virus that causes COVID-19. Institutional protocols and algorithms that pertain to the evaluation of patients at risk for COVID-19 are in a state of rapid change based on information released by regulatory bodies including the CDC and federal and state organizations. These policies and algorithms were followed during the patient's care in the ED.    As part of my medical decision making, I reviewed the following data within the electronic MEDICAL RECORD NUMBER Nursing notes reviewed and incorporated, Interpreter needed, Labs reviewed , Old chart reviewed, Radiograph reviewed , A consult was requested and obtained from this/these consultant(s) Orthopedics, Notes from prior ED visits and [place] Controlled Substance Database  ____________________________________________   FINAL CLINICAL IMPRESSION(S) / ED DIAGNOSES  Final diagnoses:  Cellulitis of right knee  Chlamydia      NEW MEDICATIONS STARTED DURING THIS VISIT:  New Prescriptions   SULFAMETHOXAZOLE-TRIMETHOPRIM (BACTRIM DS) 800-160 MG TABLET    Take 1 tablet by mouth 2 (two) times daily.     Note:  This document was prepared using Dragon voice recognition software and may include unintentional dictation errors.    Faythe Ghee, PA-C 08/07/19 1820    Delton Prairie, MD 08/07/19 2110

## 2019-08-07 NOTE — Consult Note (Signed)
ORTHOPAEDIC CONSULTATION  REQUESTING PHYSICIAN: Delton Prairie, MD  Chief Complaint:   Right knee pain.  History of Present Illness: Marcus Khan is a 39 y.o. male in otherwise good health who lives independently with his wife.  He first noted some increased pain and swelling to the anterior aspect of his right knee yesterday morning.  The symptoms have worsened over the past 24 hours, prompting him to present to the emergency room.  The patient denies any specific injury to the knee, although he notes that he did a lot of walking up and down stairs over the day or 2 prior to the onset of his symptoms.  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  He denies any fevers or chills, and denies any numbness or paresthesias down his leg to his foot.  He does complain of burning with urination and a urine culture in the emergency room was positive for chlamydia.  History reviewed. No pertinent past medical history. Past Surgical History:  Procedure Laterality Date  . VENTRAL HERNIA REPAIR N/A 10/18/2018   Procedure: HERNIA REPAIR VENTRAL ADULT - OPEN;  Surgeon: Carolan Shiver, MD;  Location: [facility] ORS;  Service: General;  Laterality: N/A;   Social History   Socioeconomic History  . Marital status: Married    Spouse name: Not on file  . Number of children: Not on file  . Years of education: Not on file  . Highest education level: Not on file  Occupational History  . Not on file  Tobacco Use  . Smoking status: Never Smoker  . Smokeless tobacco: Never Used  Substance and Sexual Activity  . Alcohol use: Never  . Drug use: Never  . Sexual activity: Not on file  Other Topics Concern  . Not on file  Social History Narrative  . Not on file   Social Determinants of Health   Financial Resource Strain:   . Difficulty of Paying Living Expenses:   Food Insecurity:   . Worried  About Programme researcher, broadcasting/film/video in the Last Year:   . Barista in the Last Year:   Transportation Needs:   . Freight forwarder (Medical):   Marland Kitchen Lack of Transportation (Non-Medical):   Physical Activity:   . Days of Exercise per Week:   . Minutes of Exercise per Session:   Stress:   . Feeling of Stress :   Social Connections:   . Frequency of Communication with Friends and Family:   . Frequency of Social Gatherings with Friends and Family:   . Attends Religious Services:   . Active Member of Clubs or Organizations:   . Attends Banker Meetings:   Marland Kitchen Marital Status:    No family history on file. No Known Allergies Prior to Admission medications   Not on File   DG Knee Complete 4 Views Right  Result Date: 08/07/2019 CLINICAL DATA:  Knee pain and swelling, no known injury, initial encounter EXAM: RIGHT KNEE - COMPLETE 4+ VIEW COMPARISON:  None. FINDINGS: No evidence of fracture, dislocation, or joint effusion. No evidence of arthropathy or other focal bone abnormality. Soft tissues are unremarkable. IMPRESSION: No acute fracture or dislocation is noted. No soft tissue abnormality is seen. Electronically Signed   By: Alcide Clever M.D.   On: 08/07/2019 14:47    Positive ROS: All other systems have been reviewed and were otherwise negative with the exception of those mentioned in the  HPI and as above.  Physical Exam: General:  Alert, no acute distress Psychiatric:  Patient is competent for consent with normal mood and affect   Cardiovascular:  No pedal edema Respiratory:  No wheezing, non-labored breathing GI:  Abdomen is soft and non-tender Skin:  No lesions in the area of chief complaint Neurologic:  Sensation intact distally Lymphatic:  No axillary or cervical lymphadenopathy  Orthopedic Exam:  Orthopedic examination is limited to the right knee and lower extremity.  Skin inspection around the right knee is noted for some swelling and erythema over the anterior  aspect of the knee beginning near the superior pole of the patella and extending down to just below the tibial tubercle.  It also extends several centimeters beyond the medial and lateral margins of the patella.  There is some bogginess in the prepatellar region, especially over the patella tendon.  There does not appear to be any effusion.  The patient is able to perform a straight leg raise and is able to actively flex his knee beyond 120 degrees without significant pain.  The knee is stable to varus valgus stressing.  He is neurovascularly intact to the right lower extremity and foot.  X-rays:  X-rays of his right knee are available for review and have been reviewed by myself.  These films demonstrate no evidence for fractures, lytic lesions, or significant degenerative changes.  Assessment: Septic right prepatellar bursitis.  Plan: Using the Spanish interpreter "on a stick", the treatment options have been discussed with the patient.  I feel that this can be managed nonsurgically at this point, especially as the patient is afebrile and not septic appearing.  He should get a dose of IV antibiotics, specifically Ancef, while in the emergency room, then be sent home with a prescription for a 10-day course of oral antibiotics.  I have recommended Septra DS 1 p.o. twice daily for 10 days.  Thank you for asking me to participate in the care of this pleasant yet unfortunate man.  We will plan on seeing him back in the office in 2 to 3 days to be sure that he is responding appropriately to the antibiotics.   Maryagnes Amos, MD  Beeper #:  [phone]  08/07/2019 5:46 PM

## 2019-08-07 NOTE — ED Triage Notes (Signed)
Pt presents to ED via POV with c/o swelling to R knee since yesterday. Pt denies known injury at this time. Pt ambulatory without difficulty.   Maryjane Hurter, interpreter in triage at this time.

## 2019-08-07 NOTE — ED Notes (Signed)
See triage note  Presents with swelling to right knee  Swelling and some pain since yesterday  No injury  Ambulates well  Min swelling noted

## 2020-01-08 ENCOUNTER — Other Ambulatory Visit: Payer: Self-pay | Admitting: Infectious Diseases

## 2020-01-08 DIAGNOSIS — G44209 Tension-type headache, unspecified, not intractable: Secondary | ICD-10-CM

## 2020-01-08 DIAGNOSIS — R29898 Other symptoms and signs involving the musculoskeletal system: Secondary | ICD-10-CM

## 2020-01-08 DIAGNOSIS — M542 Cervicalgia: Secondary | ICD-10-CM

## 2020-01-09 ENCOUNTER — Other Ambulatory Visit: Payer: Self-pay

## 2020-01-09 ENCOUNTER — Ambulatory Visit
Admission: RE | Admit: 2020-01-09 | Discharge: 2020-01-09 | Disposition: A | Discharge status: Home / Self Care | Payer: No Typology Code available for payment source | Source: Ambulatory Visit | Attending: Infectious Diseases | Admitting: Infectious Diseases

## 2020-01-09 DIAGNOSIS — R29898 Other symptoms and signs involving the musculoskeletal system: Secondary | ICD-10-CM | POA: Insufficient documentation

## 2020-01-09 DIAGNOSIS — G44209 Tension-type headache, unspecified, not intractable: Secondary | ICD-10-CM | POA: Insufficient documentation

## 2020-01-09 DIAGNOSIS — M542 Cervicalgia: Secondary | ICD-10-CM | POA: Insufficient documentation

## 2021-10-15 ENCOUNTER — Ambulatory Visit (LOCAL_COMMUNITY_HEALTH_CENTER): Payer: No Typology Code available for payment source

## 2021-10-15 DIAGNOSIS — Z23 Encounter for immunization: Secondary | ICD-10-CM | POA: Diagnosis not present

## 2021-10-15 DIAGNOSIS — Z719 Counseling, unspecified: Secondary | ICD-10-CM

## 2021-10-15 NOTE — Progress Notes (Signed)
Patient provided documentation of vaccines needed for immigration.  Interpretation provided by Rowland Lathe.  VIS given for vaccines, vaccines administered and tolerated well.  After vaccine care reviewed. Copy of [facility] provided.   Ishia Tenorio Shelda Pal, RN

## 2021-10-27 IMAGING — CT CT HEAD W/O CM
3 series · 16 of 47 positions shown, 19 images · non-contrast
Comparison: None.

CLINICAL DATA: Dizziness.

EXAM:
CT HEAD WITHOUT CONTRAST
TECHNIQUE: Contiguous axial images were obtained from the base of the skull
through the vertex without intravenous contrast.

[Series 3: head wo · axial · 0.39mm/px · z∈[-106,+19]mm · 10 of 31 slices shown, 13 images]
[im 3/31  brain]
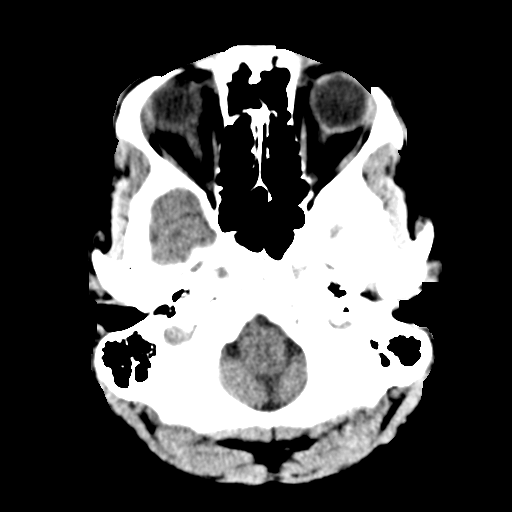
[im 3/31  bone]
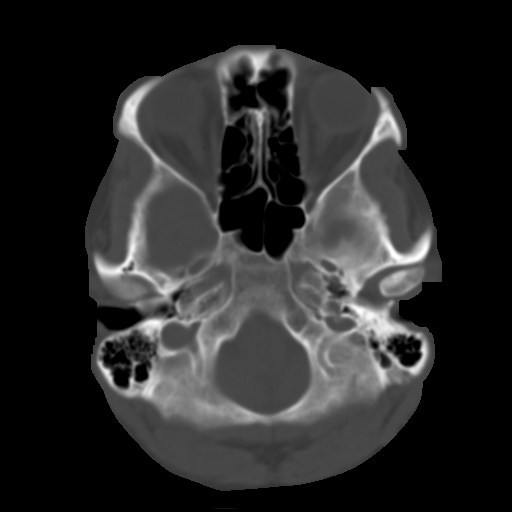
[im 6/31  brain]
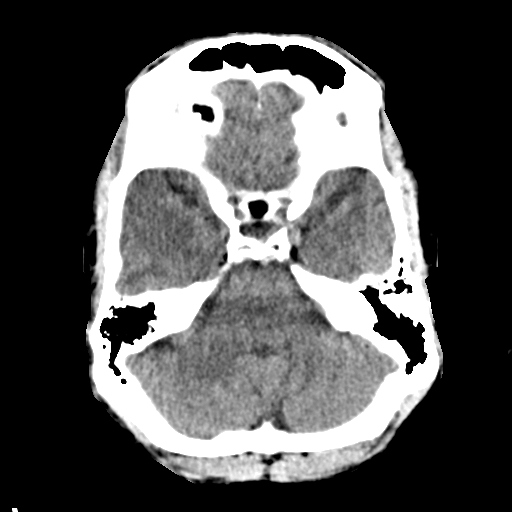
[im 9/31  brain]
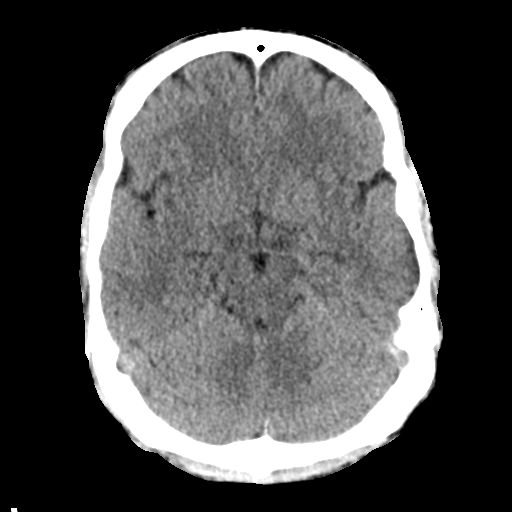
[im 11/31  brain]
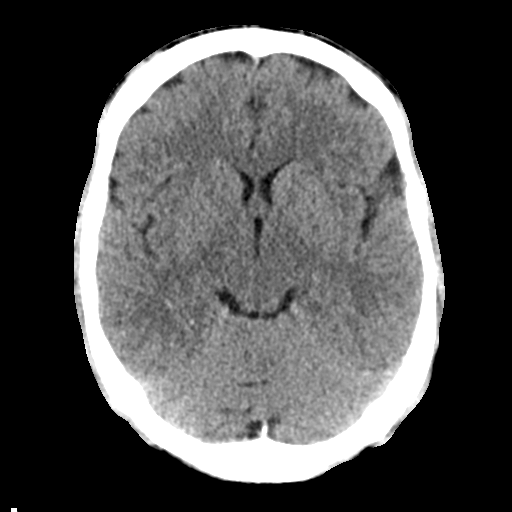
[im 14/31  brain]
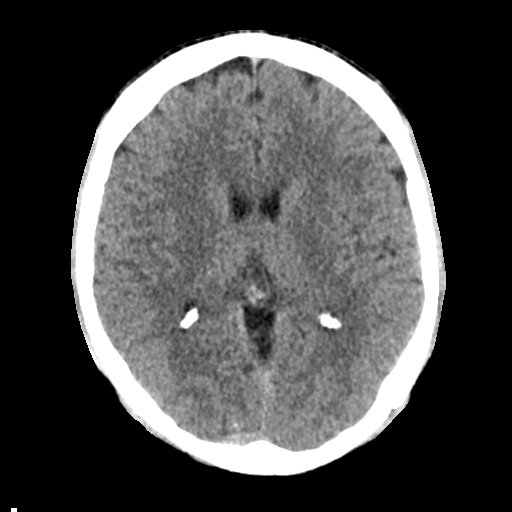
[im 14/31  bone]
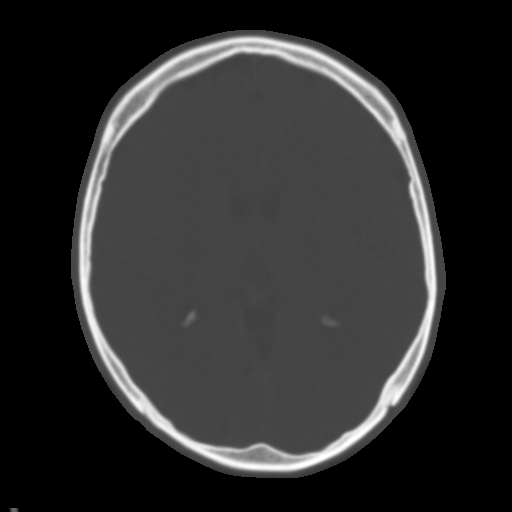
[im 17/31  brain]
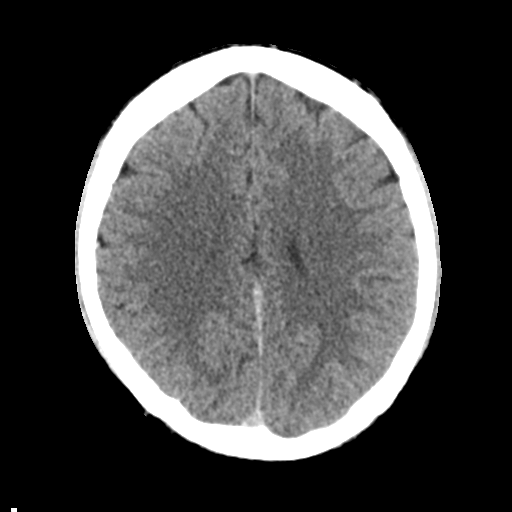
[im 20/31  brain]
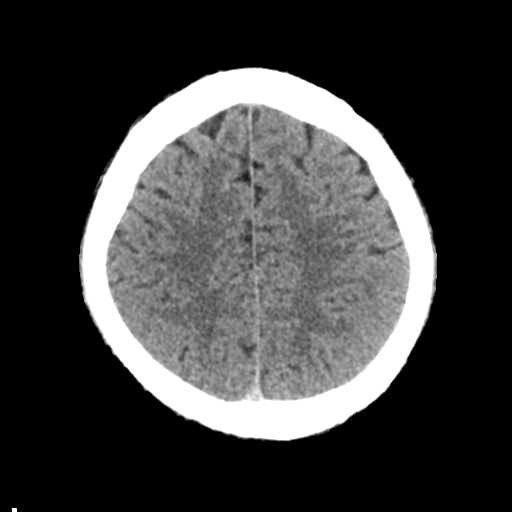
[im 23/31  brain]
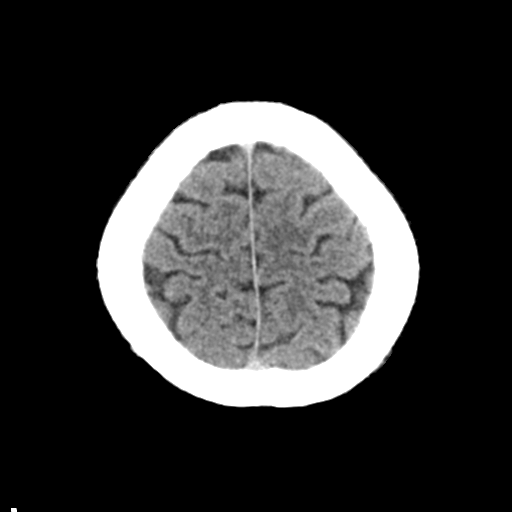
[im 25/31  brain]
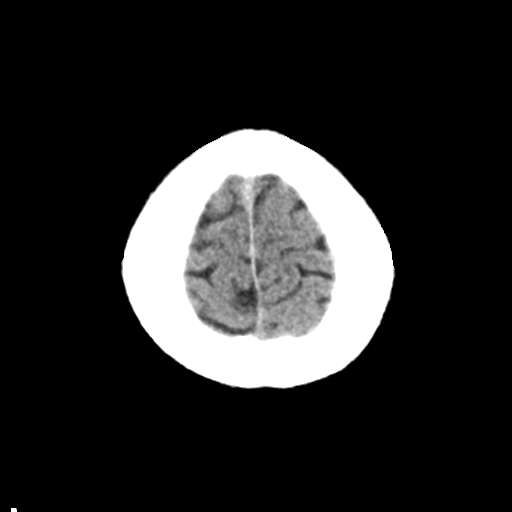
[im 25/31  bone]
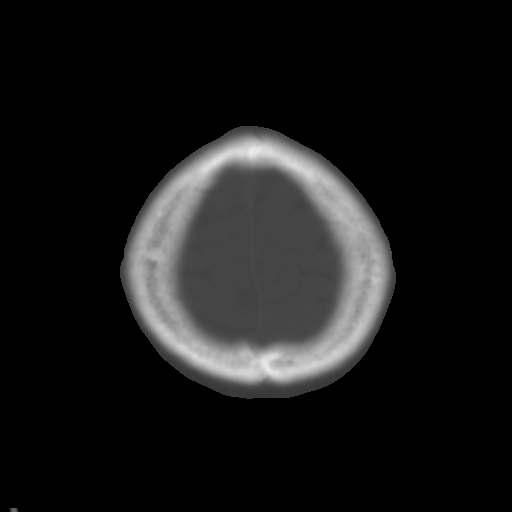
[im 28/31  brain]
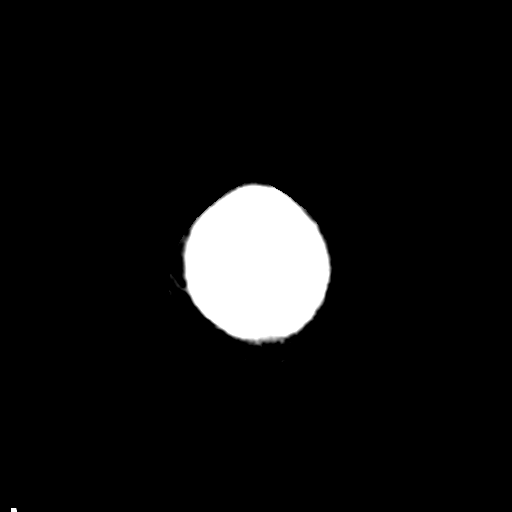

[Series 4: coronal soft tissue · coronal · 0.31mm/px · 3 of 67 slices shown]
[im 23/67  brain]
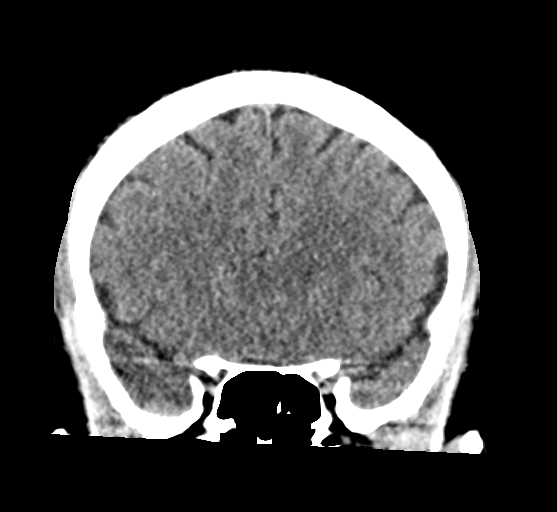
[im 30/67  brain]
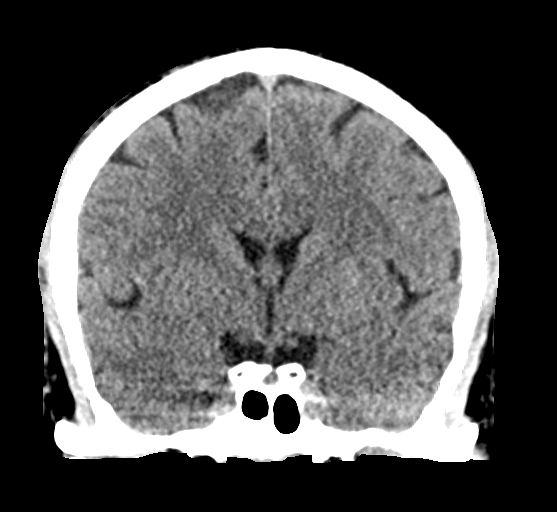
[im 37/67  brain]
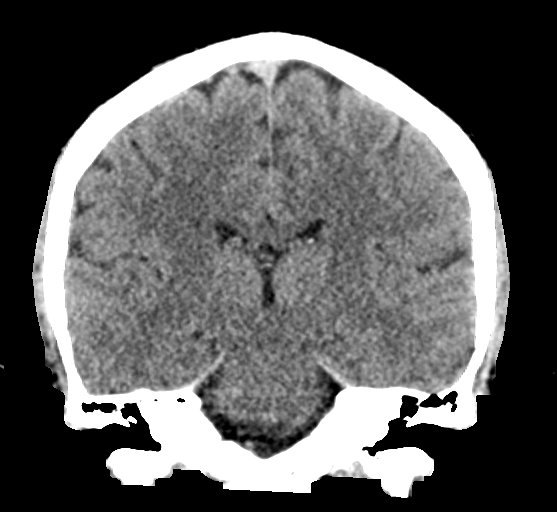

[Series 5: sagittal soft tissue · sagittal · 0.31mm/px · 3 of 59 slices shown]
[im 20/59  brain]
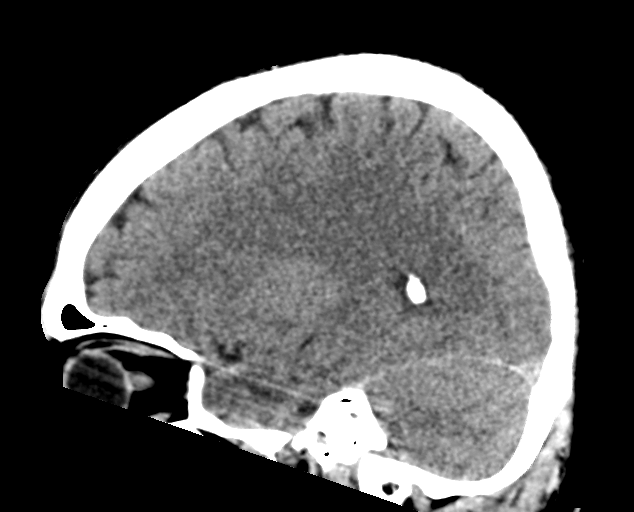
[im 30/59  brain]
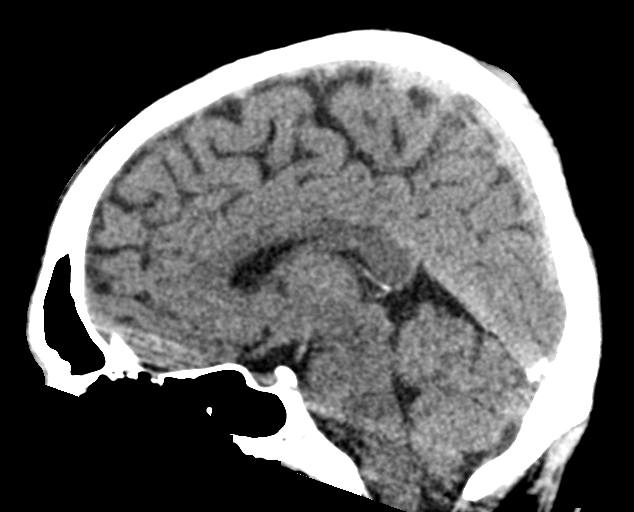
[im 39/59  brain]
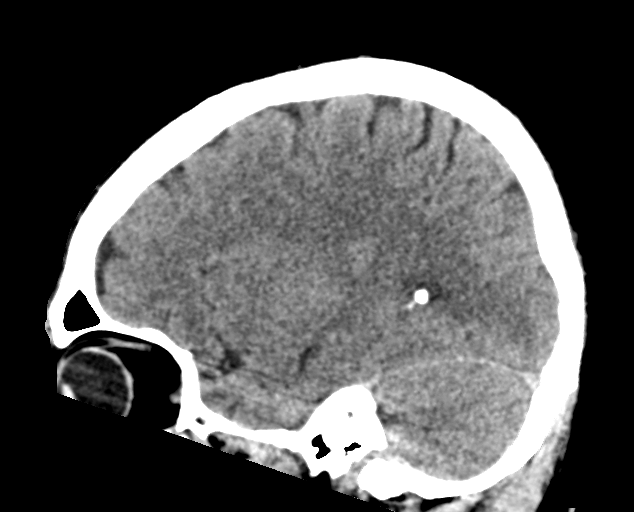

[16 of 47 positions shown; findings below may reference images not displayed]

FINDINGS: Brain: No evidence of acute infarction, hemorrhage, hydrocephalus,
extra-axial collection or mass lesion/mass effect.

Vascular: No hyperdense vessel or unexpected calcification.

Skull: Normal. Negative for fracture or focal lesion.

Sinuses/Orbits: No acute finding.

Other: None.
IMPRESSION: Normal head CT.

## 2021-12-01 ENCOUNTER — Ambulatory Visit (LOCAL_COMMUNITY_HEALTH_CENTER): Payer: No Typology Code available for payment source

## 2021-12-01 ENCOUNTER — Ambulatory Visit: Payer: No Typology Code available for payment source

## 2021-12-01 DIAGNOSIS — Z23 Encounter for immunization: Secondary | ICD-10-CM | POA: Diagnosis not present

## 2021-12-01 DIAGNOSIS — Z719 Counseling, unspecified: Secondary | ICD-10-CM

## 2021-12-01 NOTE — Progress Notes (Signed)
Pt in clinic for 2nd dose of Varicella and MMR vaccines. Eligible and administered, tolerated well. M.Collier Bohnet, LPN.
